# Patient Record
Sex: Male | Born: 1970 | Race: Black or African American | Hispanic: No | Marital: Married | Smoking: Never smoker
Health system: Southern US, Community
[De-identification: ages and names within clinical notes are randomized; demographics above are authoritative.]

## PROBLEM LIST (undated history)

## (undated) DIAGNOSIS — B019 Varicella without complication: Secondary | ICD-10-CM

## (undated) DIAGNOSIS — M1990 Unspecified osteoarthritis, unspecified site: Secondary | ICD-10-CM

## (undated) DIAGNOSIS — K921 Melena: Secondary | ICD-10-CM

## (undated) HISTORY — DX: Varicella without complication: B01.9

## (undated) HISTORY — DX: Unspecified osteoarthritis, unspecified site: M19.90

## (undated) HISTORY — PX: BILATERAL KNEE ARTHROSCOPY: SUR91

## (undated) HISTORY — DX: Melena: K92.1

---

## 1999-12-08 ENCOUNTER — Encounter: Payer: Self-pay | Admitting: *Deleted

## 1999-12-08 ENCOUNTER — Encounter: Admission: RE | Admit: 1999-12-08 | Discharge: 1999-12-08 | Payer: Self-pay | Admitting: *Deleted

## 2002-03-03 ENCOUNTER — Encounter: Payer: Self-pay | Admitting: Occupational Medicine

## 2002-03-03 ENCOUNTER — Encounter: Admission: RE | Admit: 2002-03-03 | Discharge: 2002-03-03 | Payer: Self-pay | Admitting: Occupational Medicine

## 2003-02-01 ENCOUNTER — Encounter: Payer: Self-pay | Admitting: Internal Medicine

## 2003-02-01 ENCOUNTER — Encounter: Admission: RE | Admit: 2003-02-01 | Discharge: 2003-02-01 | Payer: Self-pay | Admitting: Internal Medicine

## 2008-07-22 ENCOUNTER — Emergency Department (HOSPITAL_COMMUNITY): Admission: EM | Admit: 2008-07-22 | Discharge: 2008-07-22 | Payer: Self-pay | Admitting: Family Medicine

## 2010-07-07 IMAGING — CR DG LUMBAR SPINE 2-3V
3 series · 3 of 3 positions shown · non-contrast
Comparison: None available

CLINICAL DATA: Low back pain and spasms

LUMBAR SPINE - 2-3 VIEW

[view not recorded (1 of 3)]
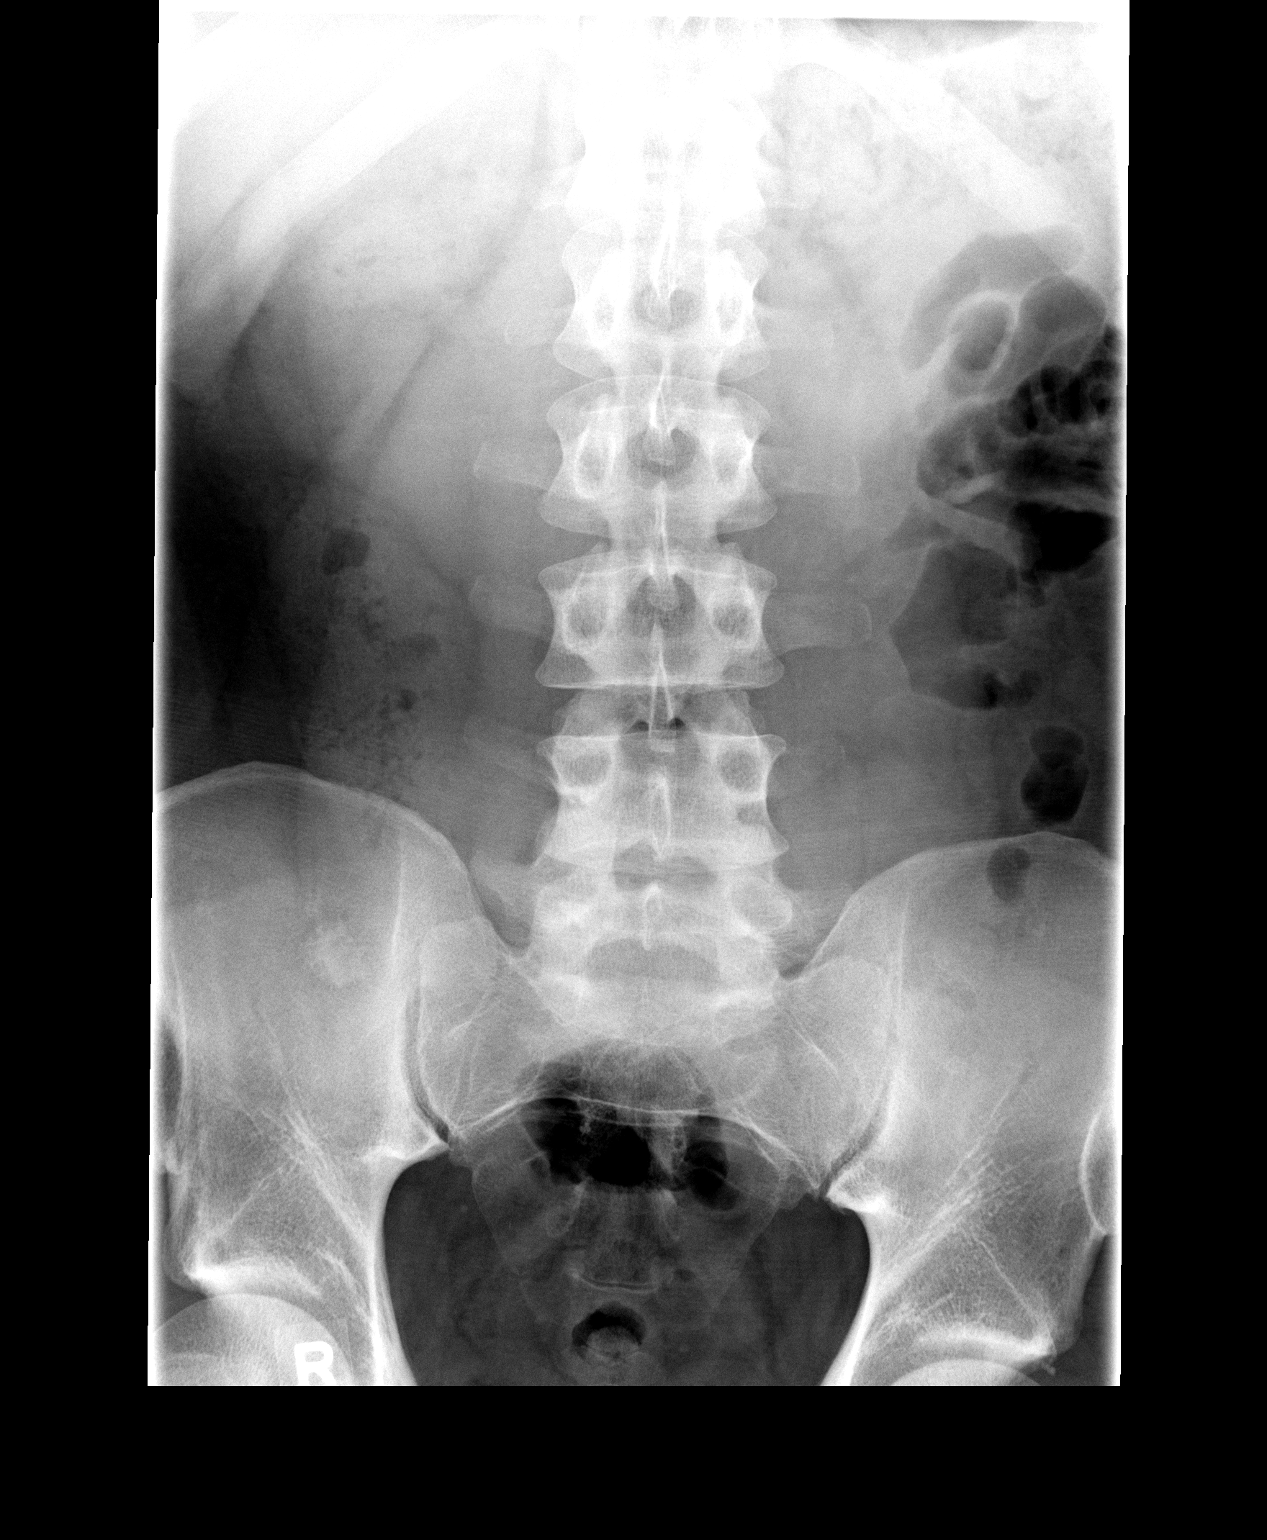

[view not recorded (2 of 3)]
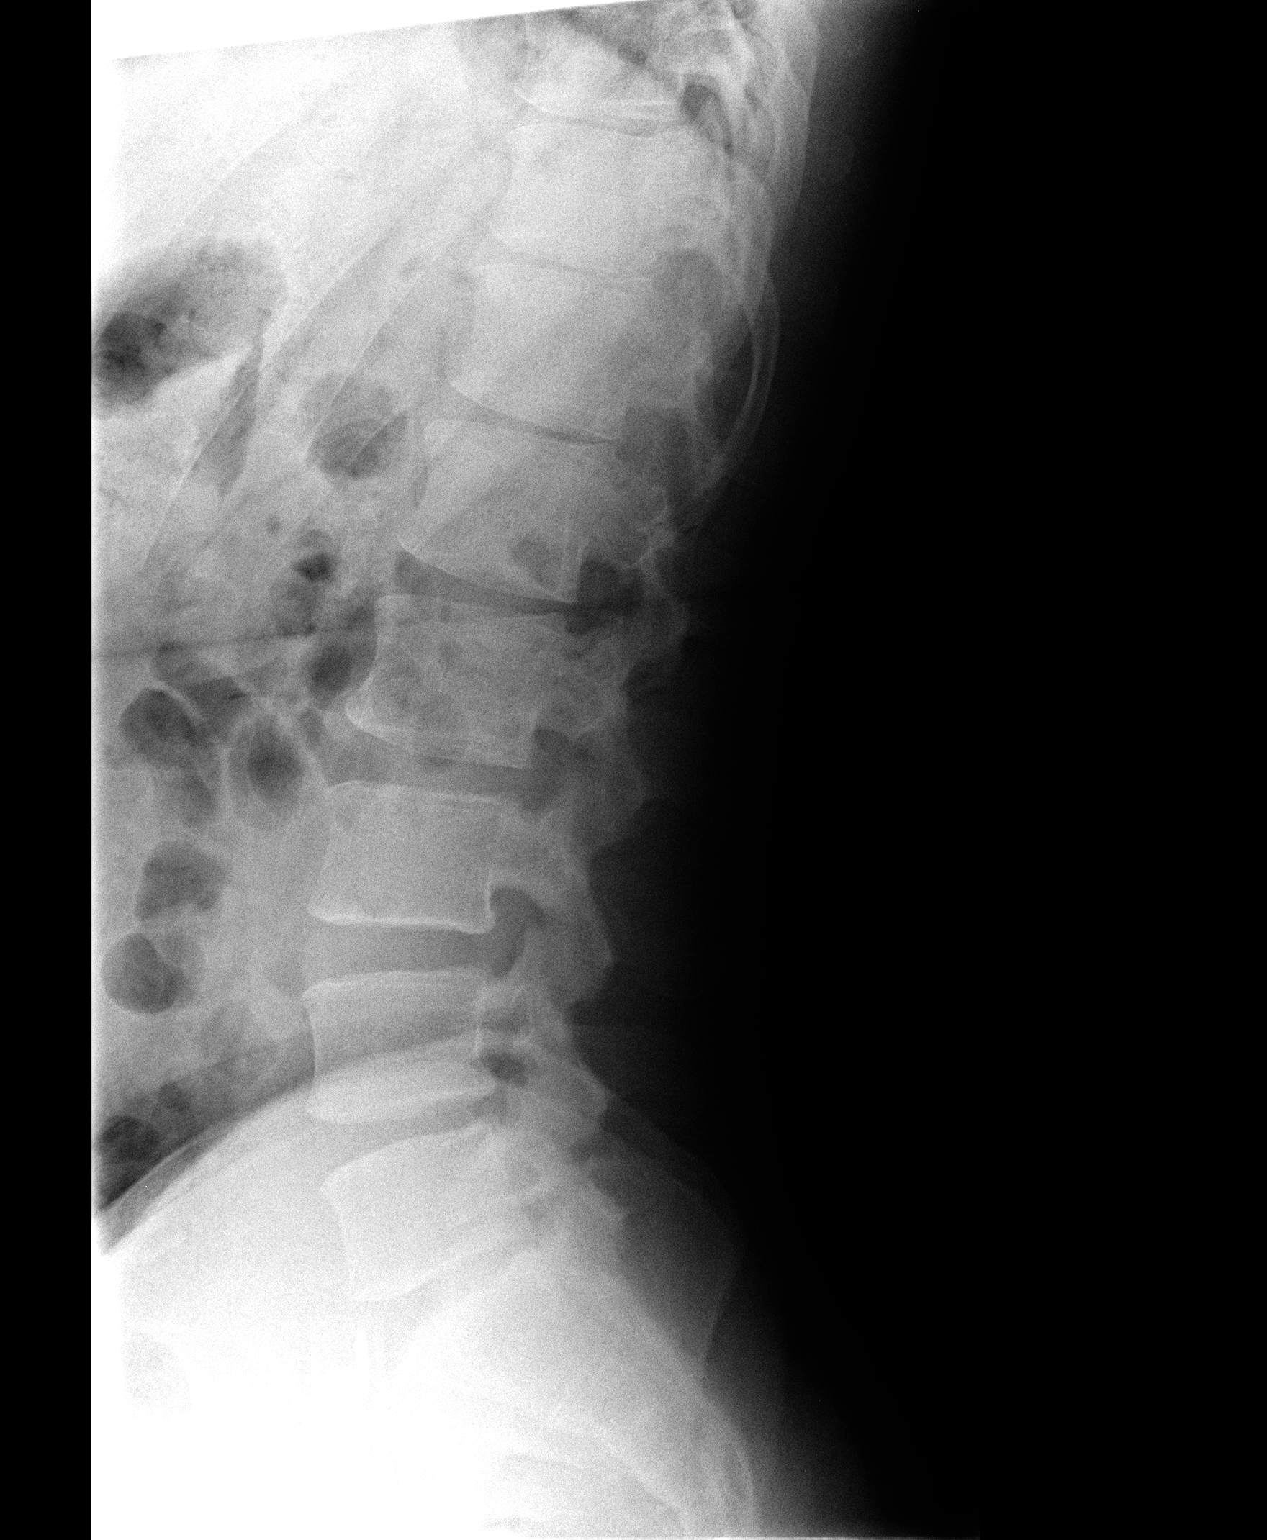

[view not recorded (3 of 3)]
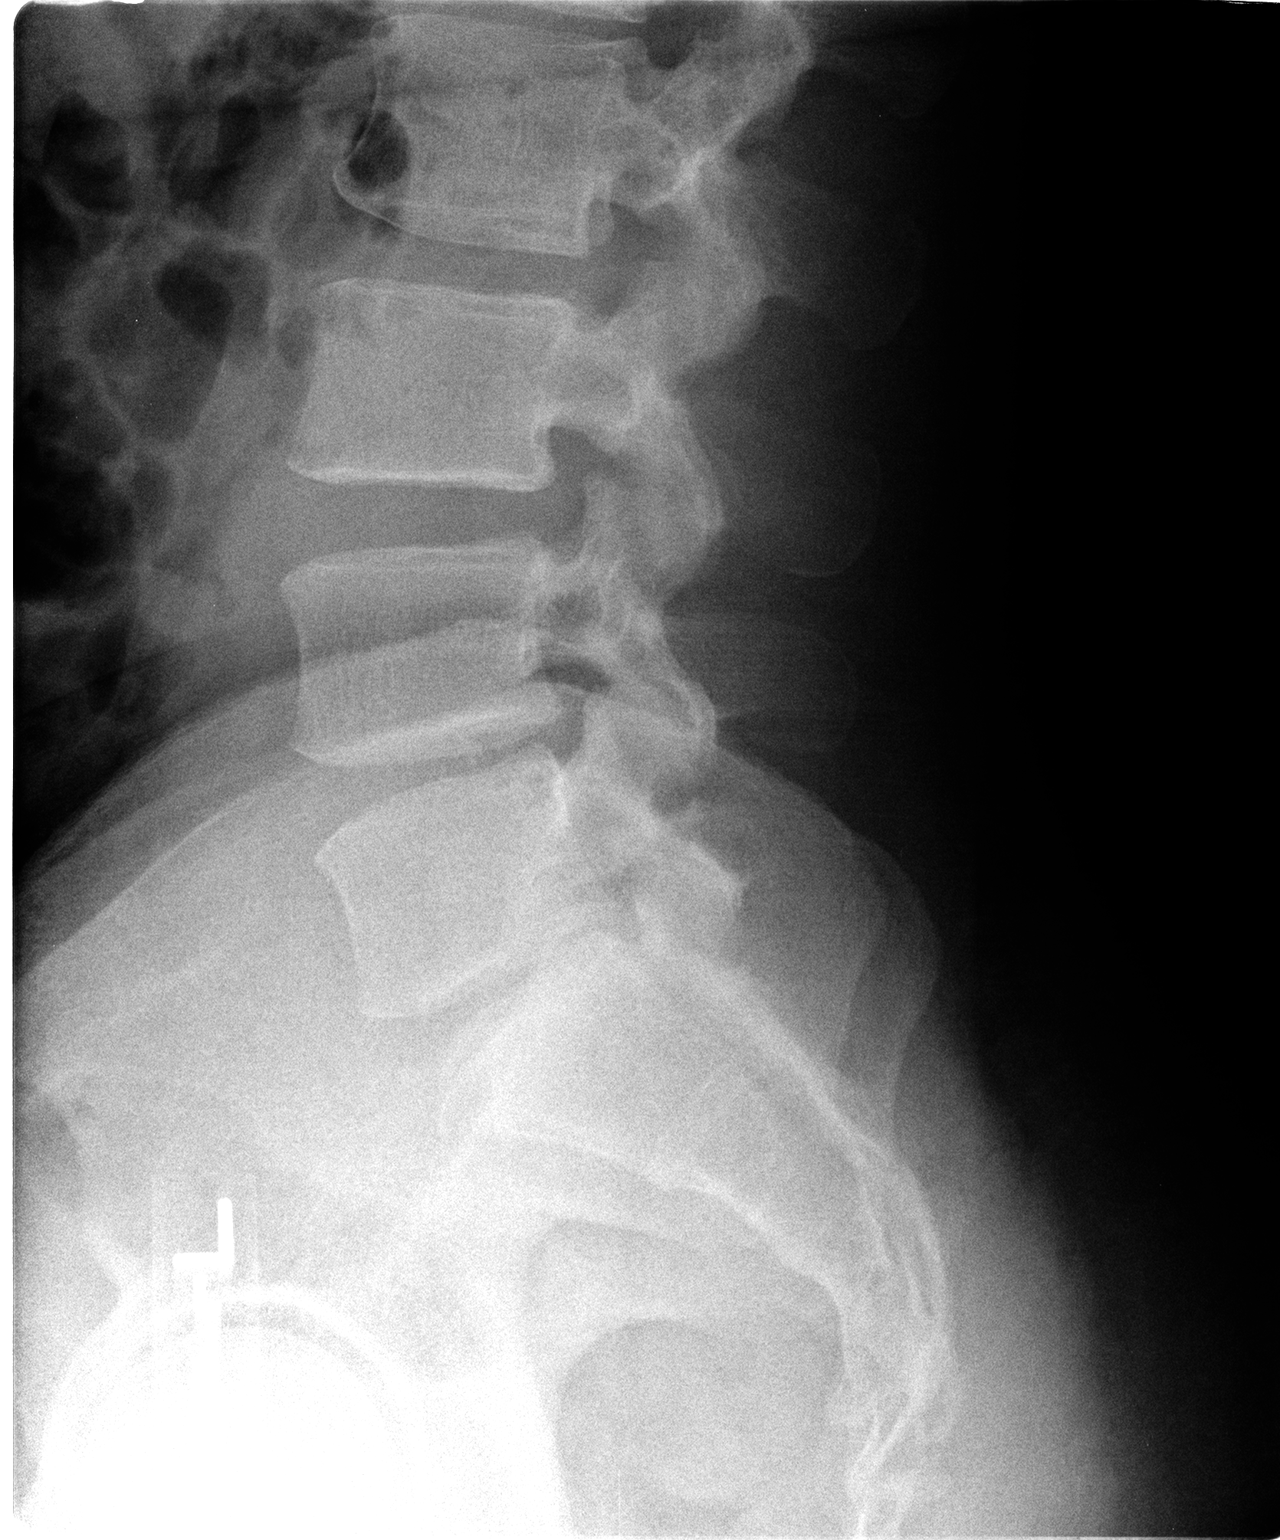

[3 of 3 positions shown; findings below may reference images not displayed]

FINDINGS: There is mild narrowing of the L4-5 interspace.  Normal
alignment.  Negative for fracture, dislocation, or other acute bony
abnormality.
IMPRESSION: 1.  Negative for fracture or other acute bone injury.
2.  Early degenerative disc disease L4-5.

## 2013-11-13 ENCOUNTER — Ambulatory Visit: Payer: Self-pay | Admitting: Physician Assistant

## 2013-11-20 ENCOUNTER — Encounter: Payer: Self-pay | Admitting: Physician Assistant

## 2013-11-20 ENCOUNTER — Ambulatory Visit (INDEPENDENT_AMBULATORY_CARE_PROVIDER_SITE_OTHER): Payer: BC Managed Care – PPO | Admitting: Physician Assistant

## 2013-11-20 VITALS — BP 143/92 | HR 74 | Temp 98.4°F | Resp 16 | Ht 71.0 in | Wt 270.0 lb

## 2013-11-20 DIAGNOSIS — M170 Bilateral primary osteoarthritis of knee: Secondary | ICD-10-CM

## 2013-11-20 DIAGNOSIS — IMO0002 Reserved for concepts with insufficient information to code with codable children: Secondary | ICD-10-CM

## 2013-11-20 DIAGNOSIS — Z125 Encounter for screening for malignant neoplasm of prostate: Secondary | ICD-10-CM

## 2013-11-20 DIAGNOSIS — M1710 Unilateral primary osteoarthritis, unspecified knee: Secondary | ICD-10-CM

## 2013-11-20 DIAGNOSIS — Z0000 Encounter for general adult medical examination without abnormal findings: Secondary | ICD-10-CM | POA: Insufficient documentation

## 2013-11-20 DIAGNOSIS — Z8669 Personal history of other diseases of the nervous system and sense organs: Secondary | ICD-10-CM

## 2013-11-20 DIAGNOSIS — R030 Elevated blood-pressure reading, without diagnosis of hypertension: Secondary | ICD-10-CM

## 2013-11-20 DIAGNOSIS — IMO0001 Reserved for inherently not codable concepts without codable children: Secondary | ICD-10-CM

## 2013-11-20 LAB — URINALYSIS, ROUTINE W REFLEX MICROSCOPIC
Bilirubin Urine: NEGATIVE
HGB URINE DIPSTICK: NEGATIVE
Ketones, ur: NEGATIVE
Leukocytes, UA: NEGATIVE
Nitrite: NEGATIVE
PH: 6 (ref 5.0–8.0)
RBC / HPF: NONE SEEN (ref 0–?)
SPECIFIC GRAVITY, URINE: 1.02 (ref 1.000–1.030)
TOTAL PROTEIN, URINE-UPE24: NEGATIVE
URINE GLUCOSE: NEGATIVE
Urobilinogen, UA: 0.2 (ref 0.0–1.0)

## 2013-11-20 LAB — CBC WITH DIFFERENTIAL/PLATELET
BASOS PCT: 0.5 % (ref 0.0–3.0)
Basophils Absolute: 0 10*3/uL (ref 0.0–0.1)
Eosinophils Absolute: 0.2 10*3/uL (ref 0.0–0.7)
Eosinophils Relative: 2.6 % (ref 0.0–5.0)
HCT: 42.5 % (ref 39.0–52.0)
Hemoglobin: 14.2 g/dL (ref 13.0–17.0)
LYMPHS PCT: 36.7 % (ref 12.0–46.0)
Lymphs Abs: 2.4 10*3/uL (ref 0.7–4.0)
MCHC: 33.3 g/dL (ref 30.0–36.0)
MCV: 90.1 fl (ref 78.0–100.0)
Monocytes Absolute: 0.4 10*3/uL (ref 0.1–1.0)
Monocytes Relative: 6 % (ref 3.0–12.0)
NEUTROS PCT: 54.2 % (ref 43.0–77.0)
Neutro Abs: 3.5 10*3/uL (ref 1.4–7.7)
Platelets: 196 10*3/uL (ref 150.0–400.0)
RBC: 4.72 Mil/uL (ref 4.22–5.81)
RDW: 14.1 % (ref 11.5–14.6)
WBC: 6.4 10*3/uL (ref 4.5–10.5)

## 2013-11-20 LAB — LIPID PANEL
CHOL/HDL RATIO: 3
CHOLESTEROL: 162 mg/dL (ref 0–200)
HDL: 46.6 mg/dL (ref >39.00)
LDL Cholesterol: 104 mg/dL — ABNORMAL HIGH (ref 0–99)
TRIGLYCERIDES: 59 mg/dL (ref 0.0–149.0)
VLDL: 11.8 mg/dL (ref 0.0–40.0)

## 2013-11-20 LAB — HEPATIC FUNCTION PANEL
ALK PHOS: 66 U/L (ref 39–117)
ALT: 30 U/L (ref 0–53)
AST: 25 U/L (ref 0–37)
Albumin: 4 g/dL (ref 3.5–5.2)
BILIRUBIN DIRECT: 0.2 mg/dL (ref 0.0–0.3)
Total Bilirubin: 0.9 mg/dL (ref 0.3–1.2)
Total Protein: 6.9 g/dL (ref 6.0–8.3)

## 2013-11-20 LAB — PSA: PSA: 1.39 ng/mL (ref 0.10–4.00)

## 2013-11-20 LAB — TSH: TSH: 0.87 u[IU]/mL (ref 0.35–5.50)

## 2013-11-20 LAB — HEMOGLOBIN A1C: Hgb A1c MFr Bld: 5.9 % (ref 4.6–6.5)

## 2013-11-20 NOTE — Assessment & Plan Note (Signed)
Medical history reviewed and updated.  Patient endorses immunizations UTD.  Will obtain fasting labs.

## 2013-11-20 NOTE — Progress Notes (Signed)
Patient presents to clinic today to establish care.  Acute Concerns: Patient requesting physical.  Patient is fasting for labs.  Elevated BP -- BP elevated at 143/92.  Patient denies headache, vision changes, chest pain, palpitations, SOB, lightheadedness or dizziness.  Denies hx of HTN.  Has significant family history of essential hypertension.   Chronic Issues: Bilateral Knee Osteoarthritis -- followed by Orthopedics.  Has injections every 6 months. Recently moved to area and needs referral to Orthopedics.  Hx of Bell's Palsy -- patient endorses history of palsy in the past.  Was not treated.  States [facility].  [facility]-sided [facility].  Denies change in vision, headaches, or change in hearing.  Health Maintenance: Dental -- Overdue Vision -- UTD Immunizations -- Tetanus 2014/2015  Past Medical History  Diagnosis Date  . Arthritis     Bilateral knees  . Blood in stool     Minor  . Chicken pox     Past Surgical History  Procedure Laterality Date  . Bilateral knee arthroscopy      No current outpatient prescriptions on file prior to visit.   No current facility-administered medications on file prior to visit.    No Known Allergies  Family History  Problem Relation Age of Onset  . Arthritis Mother [id number]    Deceased  . Arthritis Father   . Hypertension Father   . Hypertension Father   . Heart attack Mother   . Kidney disease Sister   . Diabetes Sister   . Healthy Brother     x2  . Hypertension Other     Grandparents  . Hyperlipidemia Other     Grandparents  . Throat cancer Maternal Grandfather   . Other Maternal Aunt     Skin Disorder  . Allergies Son    History   Social History  . Marital Status: Married    Spouse Name: N/A    Number of Children: N/A  . Years of Education: N/A   Occupational History  . Not on file.   Social History Main Topics  . Smoking status: Never Smoker   . Smokeless tobacco: Not on file   . Alcohol Use: No  . Drug Use: No  . Sexual Activity: Yes   Other Topics Concern  . Not on file   Social History Narrative  . No narrative on file   Review of Systems  Constitutional: Negative for fever and weight loss.  HENT: Negative for ear pain, hearing loss and tinnitus.   Eyes: Negative for blurred vision, double vision, photophobia and pain.  Respiratory: Negative for cough and shortness of breath.   Cardiovascular: Negative for chest pain and palpitations.  Gastrointestinal: Positive for blood in stool. Negative for heartburn, nausea, vomiting, abdominal pain, diarrhea, constipation and melena.  Genitourinary: Negative for dysuria, urgency, frequency, hematuria and flank pain.       Nocturia x 0.  Musculoskeletal: Positive for joint pain.  Neurological: Negative for dizziness, tingling, sensory change, speech change, seizures, loss of consciousness and headaches.  Endo/Heme/Allergies: Negative for environmental allergies.  Psychiatric/Behavioral: Negative for depression, suicidal ideas, hallucinations and substance abuse. The patient is not nervous/anxious.    BP 143/92  Pulse 74  Temp(Src) 98.4 F (36.9 C) (Oral)  Resp 16  Ht 5\' 11"  (1.803 m)  Wt 270 lb (122.471 kg)  BMI 37.67 kg/m2  SpO2 98%  Physical Exam  Vitals reviewed. Constitutional: He is oriented to person, place, and time and well-developed, well-nourished, and  in no distress.  HENT:  Head: Normocephalic and atraumatic.  Right Ear: External ear normal.  Left Ear: External ear normal.  Nose: Nose normal.  Mouth/Throat: Oropharynx is clear and moist. No oropharyngeal exudate.  Eyes: Conjunctivae are normal. Pupils are equal, round, and reactive to light.  Neck: Neck supple.  Cardiovascular: Normal rate, regular rhythm, normal heart sounds and intact distal pulses.   Pulmonary/Chest: Effort normal and breath sounds normal. No respiratory distress. He has no wheezes. He has no rales. He exhibits no  tenderness.  Abdominal: Soft. Bowel sounds are normal. He exhibits no distension and no mass. There is no tenderness. There is no rebound and no guarding.  Lymphadenopathy:    He has no cervical adenopathy.  Neurological: He is alert and oriented to person, place, and time. He has normal sensation and normal strength.  Right-sided facial weakness noted with neuro examination.  No other deficit noted.  Patient states this is chronic over past 4-5 years.  Skin: Skin is warm and dry. No rash noted.  Psychiatric: Affect normal.   Assessment/Plan: Degenerative arthritis of knee, bilateral History of multiple arthroscopies and corticosteroid injection. Needs orthopedist in the area. Will refer to Orthopedics.    Prostate cancer screening Will obtain PSA.  Visit for preventive health examination Medical history reviewed and updated.  Patient endorses immunizations UTD.  Will obtain fasting labs.  Hx of Bell's palsy Chronic left-sided facial weakness.  Will send patient to Neurology for assessment.  Elevated BP Minor elevation. Asymptomatic without hx of HTN.  + Family history of HTN. Will initiate DASH diet.  Recheck BP in 2-4 weeks.

## 2013-11-20 NOTE — Addendum Note (Signed)
Addended by: Marcelline MatesMARTIN, Adynn Caseres on: 11/20/2013 05:18 PM   Modules accepted: Orders

## 2013-11-20 NOTE — Assessment & Plan Note (Signed)
Chronic left-sided facial weakness.  Will send patient to Neurology for assessment.

## 2013-11-20 NOTE — Assessment & Plan Note (Signed)
History of multiple arthroscopies and corticosteroid injection. Needs orthopedist in the area. Will refer to Orthopedics.

## 2013-11-20 NOTE — Assessment & Plan Note (Signed)
Minor elevation. Asymptomatic without hx of HTN.  + Family history of HTN. Will initiate DASH diet.  Recheck BP in 2-4 weeks.

## 2013-11-20 NOTE — Assessment & Plan Note (Signed)
Will obtain PSA

## 2013-11-20 NOTE — Patient Instructions (Signed)
Please obtain labs. I will call you with your results.  You will be contacted by Orthopedics and Neurology for appointments.  Please read information below on the DASH diet to help prevent high blood pressure.  Follow-up in 1 month to recheck your blood pressure.  DASH Diet The DASH diet stands for "Dietary Approaches to Stop Hypertension." It is a healthy eating plan that has been shown to reduce high blood pressure (hypertension) in as little as 14 days, while also possibly providing other significant health benefits. These other health benefits include reducing the risk of breast cancer after menopause and reducing the risk of type 2 diabetes, heart disease, colon cancer, and stroke. Health benefits also include weight loss and slowing kidney failure in patients with chronic kidney disease.  DIET GUIDELINES  Limit salt (sodium). Your diet should contain less than 1500 mg of sodium daily.  Limit refined or processed carbohydrates. Your diet should include mostly whole grains. Desserts and added sugars should be used sparingly.  Include small amounts of heart-healthy fats. These types of fats include nuts, oils, and tub margarine. Limit saturated and trans fats. These fats have been shown to be harmful in the body. CHOOSING FOODS  The following food groups are based on a 2000 calorie diet. See your Registered Dietitian for individual calorie needs. Grains and Grain Products (6 to 8 servings daily)  Eat More Often: Whole-wheat bread, brown rice, whole-grain or wheat pasta, quinoa, popcorn without added fat or salt (air popped).  Eat Less Often: White bread, white pasta, white rice, cornbread. Vegetables (4 to 5 servings daily)  Eat More Often: Fresh, frozen, and canned vegetables. Vegetables may be raw, steamed, roasted, or grilled with a minimal amount of fat.  Eat Less Often/Avoid: Creamed or fried vegetables. Vegetables in a cheese sauce. Fruit (4 to 5 servings daily)  Eat More Often: All  fresh, canned (in natural juice), or frozen fruits. Dried fruits without added sugar. One hundred percent fruit juice ( cup [237 mL] daily).  Eat Less Often: Dried fruits with added sugar. Canned fruit in light or heavy syrup. [employer] LockerLean Meats, Fish, and Poultry (2 servings or less daily. One serving is 3 to 4 oz [85-114 g]).  Eat More Often: Ninety percent or leaner ground beef, tenderloin, sirloin. Round cuts of beef, chicken breast, Mal[place]key breast. All fish. Grill, bake, or broil your meat. Nothing should be fried.  Eat Less Often/Avoid: Fatty cuts of meat, [place]key, or chicken leg, thigh, or wing. Fried cuts of meat or fish. Dairy (2 to 3 servings)  Eat More Often: Low-fat or fat-free milk, low-fat plain or light yogurt, reduced-fat or part-skim cheese.  Eat Less Often/Avoid: Milk (whole, 2%).Whole milk yogurt. Full-fat cheeses. Nuts, Seeds, and Legumes (4 to 5 servings per week)  Eat More Often: All without added salt.  Eat Less Often/Avoid: Salted nuts and seeds, canned beans with added salt. Fats and Sweets (limited)  Eat More Often: Vegetable oils, tub margarines without trans fats, sugar-free gelatin. Mayonnaise and salad dressings.  Eat Less Often/Avoid: Coconut oils, palm oils, butter, stick margarine, cream, half and half, cookies, candy, pie. FOR MORE INFORMATION The Dash Diet Eating Plan: www.dashdiet.org Document Released: 07/16/2011 Document Revised: 10/19/2011 Document Reviewed: 07/16/2011 [facility] Patient Information 2014 [employer] [place].

## 2013-12-05 ENCOUNTER — Encounter: Payer: Self-pay | Admitting: Physician Assistant
